# Patient Record
Sex: Female | Born: 1955 | Race: Black or African American | Hispanic: No | Smoking: Never smoker
Health system: Southern US, Community
[De-identification: ages and names within clinical notes are randomized; demographics above are authoritative.]

## PROBLEM LIST (undated history)

## (undated) DIAGNOSIS — I639 Cerebral infarction, unspecified: Secondary | ICD-10-CM

## (undated) HISTORY — PX: REDUCTION MAMMAPLASTY: SUR839

## (undated) HISTORY — DX: Cerebral infarction, unspecified: I63.9

---

## 2011-01-05 DIAGNOSIS — I639 Cerebral infarction, unspecified: Secondary | ICD-10-CM

## 2011-01-05 HISTORY — DX: Cerebral infarction, unspecified: I63.9

## 2015-11-02 ENCOUNTER — Other Ambulatory Visit: Payer: Self-pay | Admitting: Internal Medicine

## 2015-11-02 DIAGNOSIS — Z1231 Encounter for screening mammogram for malignant neoplasm of breast: Secondary | ICD-10-CM

## 2015-11-23 ENCOUNTER — Ambulatory Visit: Payer: Self-pay

## 2015-11-23 ENCOUNTER — Ambulatory Visit
Admission: RE | Admit: 2015-11-23 | Discharge: 2015-11-23 | Disposition: A | Discharge status: Home / Self Care | Payer: Managed Care, Other (non HMO) | Source: Ambulatory Visit | Attending: Internal Medicine | Admitting: Internal Medicine

## 2015-11-23 DIAGNOSIS — Z1231 Encounter for screening mammogram for malignant neoplasm of breast: Secondary | ICD-10-CM

## 2016-01-27 ENCOUNTER — Ambulatory Visit (INDEPENDENT_AMBULATORY_CARE_PROVIDER_SITE_OTHER): Payer: Managed Care, Other (non HMO) | Admitting: Podiatry

## 2016-01-27 ENCOUNTER — Encounter: Payer: Self-pay | Admitting: Podiatry

## 2016-01-27 VITALS — BP 133/86 | HR 94 | Resp 16 | Ht 60.0 in | Wt 209.0 lb

## 2016-01-27 DIAGNOSIS — L600 Ingrowing nail: Secondary | ICD-10-CM

## 2016-01-27 NOTE — Progress Notes (Signed)
   Subjective:    Patient ID: Chloe Collins, female    DOB: 11/02/1956, 60 y.o.   MRN: [id number]  HPI Patient presents with a nail problem on their Right foot; great toe-medial. Pt stated, "wants nail checked to see if have an ingrown toenail".  Review of Systems  Respiratory: Positive for cough.   All other systems reviewed and are negative.      Objective:   Physical Exam        Assessment & Plan:

## 2016-01-27 NOTE — Patient Instructions (Signed)

## 2016-01-30 NOTE — Progress Notes (Signed)
Subjective:     Patient ID: Chloe Collins, female   DOB: 1956-06-21, 60 y.o.   MRN: [id number]  HPI patient presents with painful ingrown toenail the right hallux medial border that she cannot cut and has tried to soak without relief. Has been present for several months and does take Coumadin but her condition is stable   Review of Systems  All other systems reviewed and are negative.      Objective:   Physical Exam  Constitutional: She is oriented to person, place, and time.  Cardiovascular: Intact distal pulses.   Musculoskeletal: Normal range of motion.  Neurological: She is oriented to person, place, and time.  Skin: Skin is warm.  Nursing note and vitals reviewed.  neurovascular status found to be intact muscle strength adequate range of motion within normal limits with no obvious bruising occurring on her extremities. Patient's noted to have an ingrown incurvated right hallux medial border that's painful when pressed and is found to have good digital perfusion and is well oriented 3     Assessment:     Localized ingrown toenail deformity right hallux medial border    Plan:     H&P condition reviewed. I've recommended removal of the corner and explained procedure and risk and she wants this done. At this time I went ahead I infiltrated 60 mg like Marcaine mixture remove the medial border exposed matrix and applied phenol 3 applications 30 seconds followed by alcohol lavage and sterile dressing. I applied compression gave instructions on elevation and reappoint to recheck

## 2016-02-04 ENCOUNTER — Encounter (HOSPITAL_COMMUNITY): Payer: Self-pay | Admitting: Emergency Medicine

## 2016-02-04 ENCOUNTER — Emergency Department (HOSPITAL_COMMUNITY)
Admission: EM | Admit: 2016-02-04 | Discharge: 2016-02-04 | Disposition: A | Discharge status: Home / Self Care | Payer: Managed Care, Other (non HMO) | Attending: Emergency Medicine | Admitting: Emergency Medicine

## 2016-02-04 DIAGNOSIS — J069 Acute upper respiratory infection, unspecified: Secondary | ICD-10-CM

## 2016-02-04 DIAGNOSIS — Z79899 Other long term (current) drug therapy: Secondary | ICD-10-CM | POA: Insufficient documentation

## 2016-02-04 DIAGNOSIS — Z7901 Long term (current) use of anticoagulants: Secondary | ICD-10-CM | POA: Diagnosis not present

## 2016-02-04 DIAGNOSIS — Z8673 Personal history of transient ischemic attack (TIA), and cerebral infarction without residual deficits: Secondary | ICD-10-CM | POA: Insufficient documentation

## 2016-02-04 DIAGNOSIS — Z880 Allergy status to penicillin: Secondary | ICD-10-CM | POA: Diagnosis not present

## 2016-02-04 DIAGNOSIS — Z7984 Long term (current) use of oral hypoglycemic drugs: Secondary | ICD-10-CM | POA: Diagnosis not present

## 2016-02-04 DIAGNOSIS — E1165 Type 2 diabetes mellitus with hyperglycemia: Secondary | ICD-10-CM | POA: Diagnosis present

## 2016-02-04 DIAGNOSIS — R739 Hyperglycemia, unspecified: Secondary | ICD-10-CM

## 2016-02-04 LAB — CBC WITH DIFFERENTIAL/PLATELET
BASOS ABS: 0 10*3/uL (ref 0.0–0.1)
Basophils Relative: 0 %
Eosinophils Absolute: 0 10*3/uL (ref 0.0–0.7)
Eosinophils Relative: 1 %
HEMATOCRIT: 45.6 % (ref 36.0–46.0)
HEMOGLOBIN: 14.6 g/dL (ref 12.0–15.0)
LYMPHS PCT: 18 %
Lymphs Abs: 1.2 10*3/uL (ref 0.7–4.0)
MCH: 28.3 pg (ref 26.0–34.0)
MCHC: 32 g/dL (ref 30.0–36.0)
MCV: 88.5 fL (ref 78.0–100.0)
Monocytes Absolute: 0.2 10*3/uL (ref 0.1–1.0)
Monocytes Relative: 3 %
NEUTROS ABS: 5.4 10*3/uL (ref 1.7–7.7)
NEUTROS PCT: 78 %
Platelets: 263 10*3/uL (ref 150–400)
RBC: 5.15 MIL/uL — AB (ref 3.87–5.11)
RDW: 13.8 % (ref 11.5–15.5)
WBC: 6.9 10*3/uL (ref 4.0–10.5)

## 2016-02-04 LAB — BASIC METABOLIC PANEL
ANION GAP: 11 (ref 5–15)
BUN: 15 mg/dL (ref 6–20)
CO2: 25 mmol/L (ref 22–32)
Calcium: 9 mg/dL (ref 8.9–10.3)
Chloride: 102 mmol/L (ref 101–111)
Creatinine, Ser: 0.87 mg/dL (ref 0.44–1.00)
GFR calc Af Amer: >60 mL/min (ref >60)
GFR calc non Af Amer: >60 mL/min (ref >60)
GLUCOSE: 275 mg/dL — AB (ref 65–99)
POTASSIUM: 5.1 mmol/L (ref 3.5–5.1)
Sodium: 138 mmol/L (ref 135–145)

## 2016-02-04 LAB — CBG MONITORING, ED: Glucose-Capillary: 264 mg/dL — ABNORMAL HIGH (ref 65–99)

## 2016-02-04 NOTE — ED Provider Notes (Signed)
CSN: [id number]     Arrival date & time 02/04/16  1736 History  By signing my name below, I, Emmanuella Mensah, attest that this documentation has been prepared under the direction and in the presence of Anissia Wessells, PA-C . Electronically Signed: Angelene Giovanni, ED Scribe. 02/04/2016. 6:32 PM.    Chief Complaint  Patient presents with  . Hyperglycemia   The history is provided by the patient. No language interpreter was used.   HPI Comments: Chloe Collins is a 60 y.o. female with a hx of DM and stroke who presents to the Emergency Department complaining of hyperglycemia with her highest blood sugar being approx. 404 earlier today PTA. She states that she went to urgent care today for respiratory symptoms and CP where she received Solumedrol and Rocephin after a normal chest x-ray. She adds that her respiratory issues have been ongoing since 01/20/16 where she received a Z-pack and Prednisone from her PCP but needed to go to urgent care today because those medications did not provide relief. She reports that she has been compliant with her Metformin but has not taken her medication today. She denies that she is current smoker. Pt is currently on Albuterol. No fever, SOB, wheezing, or n/v.    Past Medical History  Diagnosis Date  . Stroke [facility]) 01/05/2011    left hemiplegia   History reviewed. No pertinent past surgical history. No family history on file. Social History  Substance Use Topics  . Smoking status: Never Smoker   . Smokeless tobacco: None  . Alcohol Use: None   OB History    No data available     Review of Systems  Constitutional: Positive for chills. Negative for fever.  HENT: Positive for congestion.   Respiratory: Positive for cough. Negative for shortness of breath and wheezing.   Gastrointestinal: Negative for nausea and vomiting.      Allergies  Amoxicillin and Sulfa antibiotics  Home Medications   Prior to Admission medications   Medication Sig  Start Date End Date Taking? Authorizing Provider  albuterol (PROVENTIL) (2.5 MG/3ML) 0.083% nebulizer solution  10/30/15   Historical Provider, MD  atorvastatin (LIPITOR) 40 MG tablet  01/12/16   Historical Provider, MD  buPROPion (WELLBUTRIN XL) 150 MG 24 hr tablet  01/25/16   Historical Provider, MD  CHERATUSSIN AC 100-10 MG/5ML syrup  01/20/16   Historical Provider, MD  escitalopram (LEXAPRO) 20 MG tablet  01/25/16   Historical Provider, MD  FREESTYLE LITE test strip  01/26/16   Historical Provider, MD  lisinopril (PRINIVIL,ZESTRIL) 10 MG tablet  01/26/16   Historical Provider, MD  metFORMIN (GLUCOPHAGE) 1000 MG tablet  01/12/16   Historical Provider, MD  PROAIR HFA 108 [phone] Base) MCG/ACT inhaler  10/27/15   Historical Provider, MD  warfarin (COUMADIN) 5 MG tablet Take 5 mg by mouth daily. Pt takes every day, except on Tuesday and Thursday - takes 2.5 mg on those days. Dosage subject to INR 01/25/16   Historical Provider, MD   BP 145/78 mmHg  Pulse 98  Temp(Src) 98 F (36.7 C) (Oral)  Resp 18  SpO2 96% Physical Exam  Constitutional: She is oriented to person, place, and time. She appears well-developed and well-nourished. No distress.  HENT:  Head: Normocephalic and atraumatic.  Right Ear: External ear normal.  Left Ear: External ear normal.  Nose: Nose normal.  Mouth/Throat: Oropharynx is clear and moist.  Eyes: Conjunctivae and EOM are normal.  Neck: Neck supple. No tracheal deviation present.  Cardiovascular: Normal rate,  regular rhythm and normal heart sounds.   Pulmonary/Chest: Effort normal and breath sounds normal. No respiratory distress. She has no wheezes. She has no rales.  Musculoskeletal: Normal range of motion.  Neurological: She is alert and oriented to person, place, and time.  Skin: Skin is warm and dry.  Psychiatric: She has a normal mood and affect. Her behavior is normal.  Nursing note and vitals reviewed.   ED Course  Procedures (including critical care  time) DIAGNOSTIC STUDIES: Oxygen Saturation is 96% on RA, normal by my interpretation.    COORDINATION OF CARE: 6:29 PM- Pt advised of plan for treatment and pt agrees. Pt will receive blood work for further evaluation.    Labs Review Labs Reviewed  CBG MONITORING, ED - Abnormal; Notable for the following:    Glucose-Capillary 264 (*)    All other components within normal limits  CBC WITH DIFFERENTIAL/PLATELET  BASIC METABOLIC PANEL    Jaynie Crumble, PA-C has personally reviewed and evaluated these images and lab results as part of her medical decision-making.   MDM   Final diagnoses:  Hyperglycemia  URI (upper respiratory infection)   Patient with upper respiratory symptoms, has already taken a Z-Pak, prednisone, today she received Solu-Medrol, Rocephin IM at doctor's office. She is also using albuterol inhaler. She states she feels better but since receiving Solu-Medrol her blood sugar went up to 400. She was sent here for further evaluation. Today her blood sugar is 264 here. I will check metabolic panel. She has no other complaints. Vital signs are normal.  Patient is metabolic panel is normal other than hyperglycemia. Blood sugar is 275. This could be elevated from Solu-Medrol which patient received recently. She also has not taken her metformin today. I do not think she needs any further emergent workup. She'll be discharged home, instructed to take her metformin and have her follow up with primary care doctor. Also advised to make sure and watch her diet extra carefully since she received steroid injection today  Filed Vitals:   02/04/16 1754  BP: 145/78  Pulse: 98  Temp: 98 F (36.7 C)  TempSrc: Oral  Resp: 18  SpO2: 96%     Jaynie Crumble, PA-C 02/04/16 1930  Raeford Razor, MD 02/09/16 [phone]

## 2016-02-04 NOTE — ED Notes (Signed)
Pt reports been seen at urgent care for URI, received solumedrol. CBG at urgent care was high per pt and came to ed for further evaluation.  pt has type 2 diabetes,ans sts has not take her metformin today. CBG tested in triage 265. Pt denies any symptoms . Alert and oriented x 4.

## 2016-02-04 NOTE — Discharge Instructions (Signed)
Make sure to take your metformin when you get home. Watch your diet carefully. Follow up with your doctor for recheck.   Hyperglycemia Hyperglycemia occurs when the glucose (sugar) in your blood is too high. Hyperglycemia can happen for many reasons, but it most often happens to people who do not know they have diabetes or are not managing their diabetes properly.  CAUSES  Whether you have diabetes or not, there are other causes of hyperglycemia. Hyperglycemia can occur when you have diabetes, but it can also occur in other situations that you might not be as aware of, such as: Diabetes  If you have diabetes and are having problems controlling your blood glucose, hyperglycemia could occur because of some of the following reasons:  Not following your meal plan.  Not taking your diabetes medications or not taking it properly.  Exercising less or doing less activity than you normally do.  Being sick. Pre-diabetes  This cannot be ignored. Before people develop Type 2 diabetes, they almost always have "pre-diabetes." This is when your blood glucose levels are higher than normal, but not yet high enough to be diagnosed as diabetes. Research has shown that some long-term damage to the body, especially the heart and circulatory system, may already be occurring during pre-diabetes. If you take action to manage your blood glucose when you have pre-diabetes, you may delay or prevent Type 2 diabetes from developing. Stress  If you have diabetes, you may be "diet" controlled or on oral medications or insulin to control your diabetes. However, you may find that your blood glucose is higher than usual in the hospital whether you have diabetes or not. This is often referred to as "stress hyperglycemia." Stress can elevate your blood glucose. This happens because of hormones put out by the body during times of stress. If stress has been the cause of your high blood glucose, it can be followed regularly by your  caregiver. That way he/she can make sure your hyperglycemia does not continue to get worse or progress to diabetes. Steroids  Steroids are medications that act on the infection fighting system (immune system) to block inflammation or infection. One side effect can be a rise in blood glucose. Most people can produce enough extra insulin to allow for this rise, but for those who cannot, steroids make blood glucose levels go even higher. It is not unusual for steroid treatments to "uncover" diabetes that is developing. It is not always possible to determine if the hyperglycemia will go away after the steroids are stopped. A special blood test called an A1c is sometimes done to determine if your blood glucose was elevated before the steroids were started. SYMPTOMS  Thirsty.  Frequent urination.  Dry mouth.  Blurred vision.  Tired or fatigue.  Weakness.  Sleepy.  Tingling in feet or leg. DIAGNOSIS  Diagnosis is made by monitoring blood glucose in one or all of the following ways:  A1c test. This is a chemical found in your blood.  Fingerstick blood glucose monitoring.  Laboratory results. TREATMENT  First, knowing the cause of the hyperglycemia is important before the hyperglycemia can be treated. Treatment may include, but is not be limited to:  Education.  Change or adjustment in medications.  Change or adjustment in meal plan.  Treatment for an illness, infection, etc.  More frequent blood glucose monitoring.  Change in exercise plan.  Decreasing or stopping steroids.  Lifestyle changes. HOME CARE INSTRUCTIONS   Test your blood glucose as directed.  Exercise regularly.  Your caregiver will give you instructions about exercise. Pre-diabetes or diabetes which comes on with stress is helped by exercising.  Eat wholesome, balanced meals. Eat often and at regular, fixed times. Your caregiver or nutritionist will give you a meal plan to guide your sugar intake.  Being at  an ideal weight is important. If needed, losing as little as 10 to 15 pounds may help improve blood glucose levels. SEEK MEDICAL CARE IF:   You have questions about medicine, activity, or diet.  You continue to have symptoms (problems such as increased thirst, urination, or weight gain). SEEK IMMEDIATE MEDICAL CARE IF:   You are vomiting or have diarrhea.  Your breath smells fruity.  You are breathing faster or slower.  You are very sleepy or incoherent.  You have numbness, tingling, or pain in your feet or hands.  You have chest pain.  Your symptoms get worse even though you have been following your caregiver's orders.  If you have any other questions or concerns.   This information is not intended to replace advice given to you by your health care provider. Make sure you discuss any questions you have with your health care provider.   Document Released: 05/29/2001 Document Revised: 02/25/2012 Document Reviewed: 08/09/2015 Elsevier Interactive Patient Education [employer].

## 2016-11-16 ENCOUNTER — Other Ambulatory Visit: Payer: Self-pay | Admitting: Family Medicine

## 2016-11-16 DIAGNOSIS — Z1231 Encounter for screening mammogram for malignant neoplasm of breast: Secondary | ICD-10-CM

## 2017-02-05 ENCOUNTER — Ambulatory Visit
Admission: RE | Admit: 2017-02-05 | Discharge: 2017-02-05 | Disposition: A | Discharge status: Home / Self Care | Payer: BLUE CROSS/BLUE SHIELD | Source: Ambulatory Visit | Attending: Family Medicine | Admitting: Family Medicine

## 2017-02-05 DIAGNOSIS — Z1231 Encounter for screening mammogram for malignant neoplasm of breast: Secondary | ICD-10-CM

## 2017-05-15 ENCOUNTER — Other Ambulatory Visit: Payer: Self-pay | Admitting: Family Medicine

## 2017-05-15 DIAGNOSIS — R918 Other nonspecific abnormal finding of lung field: Secondary | ICD-10-CM

## 2017-05-16 ENCOUNTER — Ambulatory Visit
Admission: RE | Admit: 2017-05-16 | Discharge: 2017-05-16 | Disposition: A | Discharge status: Home / Self Care | Payer: BLUE CROSS/BLUE SHIELD | Source: Ambulatory Visit | Attending: Family Medicine | Admitting: Family Medicine

## 2017-05-16 DIAGNOSIS — R918 Other nonspecific abnormal finding of lung field: Secondary | ICD-10-CM

## 2017-05-16 MED ORDER — IOPAMIDOL (ISOVUE-300) INJECTION 61%
75.0000 mL | Freq: Once | INTRAVENOUS | Status: AC | PRN
  Administered 2017-05-16: 75 mL via INTRAVENOUS

## 2018-03-27 ENCOUNTER — Other Ambulatory Visit: Payer: Self-pay | Admitting: Family Medicine

## 2018-03-27 DIAGNOSIS — Z1231 Encounter for screening mammogram for malignant neoplasm of breast: Secondary | ICD-10-CM

## 2018-04-24 ENCOUNTER — Ambulatory Visit
Admission: RE | Admit: 2018-04-24 | Discharge: 2018-04-24 | Disposition: A | Discharge status: Home / Self Care | Payer: BLUE CROSS/BLUE SHIELD | Source: Ambulatory Visit | Attending: Family Medicine | Admitting: Family Medicine

## 2018-04-24 DIAGNOSIS — Z1231 Encounter for screening mammogram for malignant neoplasm of breast: Secondary | ICD-10-CM

## 2018-11-19 DIAGNOSIS — Z86711 Personal history of pulmonary embolism: Secondary | ICD-10-CM | POA: Diagnosis not present

## 2018-11-19 DIAGNOSIS — Z7901 Long term (current) use of anticoagulants: Secondary | ICD-10-CM | POA: Diagnosis not present

## 2018-11-24 DIAGNOSIS — Z0100 Encounter for examination of eyes and vision without abnormal findings: Secondary | ICD-10-CM | POA: Diagnosis not present

## 2018-12-18 DIAGNOSIS — D6859 Other primary thrombophilia: Secondary | ICD-10-CM | POA: Diagnosis not present

## 2018-12-18 DIAGNOSIS — Z7901 Long term (current) use of anticoagulants: Secondary | ICD-10-CM | POA: Diagnosis not present

## 2018-12-26 DIAGNOSIS — Z86711 Personal history of pulmonary embolism: Secondary | ICD-10-CM | POA: Diagnosis not present

## 2018-12-26 DIAGNOSIS — Z7901 Long term (current) use of anticoagulants: Secondary | ICD-10-CM | POA: Diagnosis not present

## 2019-01-05 DIAGNOSIS — Z7901 Long term (current) use of anticoagulants: Secondary | ICD-10-CM | POA: Diagnosis not present

## 2019-02-12 DIAGNOSIS — Z7901 Long term (current) use of anticoagulants: Secondary | ICD-10-CM | POA: Diagnosis not present

## 2019-02-20 DIAGNOSIS — Z7901 Long term (current) use of anticoagulants: Secondary | ICD-10-CM | POA: Diagnosis not present

## 2019-02-20 DIAGNOSIS — Z86711 Personal history of pulmonary embolism: Secondary | ICD-10-CM | POA: Diagnosis not present

## 2019-02-27 DIAGNOSIS — Z7901 Long term (current) use of anticoagulants: Secondary | ICD-10-CM | POA: Diagnosis not present

## 2019-02-27 DIAGNOSIS — Z86711 Personal history of pulmonary embolism: Secondary | ICD-10-CM | POA: Diagnosis not present

## 2019-02-28 DIAGNOSIS — J209 Acute bronchitis, unspecified: Secondary | ICD-10-CM | POA: Diagnosis not present

## 2019-03-06 DIAGNOSIS — Z7901 Long term (current) use of anticoagulants: Secondary | ICD-10-CM | POA: Diagnosis not present

## 2019-03-06 DIAGNOSIS — I6930 Unspecified sequelae of cerebral infarction: Secondary | ICD-10-CM | POA: Diagnosis not present

## 2019-03-20 DIAGNOSIS — Z86711 Personal history of pulmonary embolism: Secondary | ICD-10-CM | POA: Diagnosis not present

## 2019-03-20 DIAGNOSIS — Z7901 Long term (current) use of anticoagulants: Secondary | ICD-10-CM | POA: Diagnosis not present

## 2019-04-03 ENCOUNTER — Other Ambulatory Visit: Payer: Self-pay | Admitting: Family Medicine

## 2019-04-03 DIAGNOSIS — Z1231 Encounter for screening mammogram for malignant neoplasm of breast: Secondary | ICD-10-CM

## 2019-04-15 DIAGNOSIS — E1159 Type 2 diabetes mellitus with other circulatory complications: Secondary | ICD-10-CM | POA: Diagnosis not present

## 2019-04-15 DIAGNOSIS — J45909 Unspecified asthma, uncomplicated: Secondary | ICD-10-CM | POA: Diagnosis not present

## 2019-04-15 DIAGNOSIS — I6930 Unspecified sequelae of cerebral infarction: Secondary | ICD-10-CM | POA: Diagnosis not present

## 2019-04-15 DIAGNOSIS — Z0000 Encounter for general adult medical examination without abnormal findings: Secondary | ICD-10-CM | POA: Diagnosis not present

## 2019-04-15 DIAGNOSIS — E7800 Pure hypercholesterolemia, unspecified: Secondary | ICD-10-CM | POA: Diagnosis not present

## 2019-04-15 DIAGNOSIS — I10 Essential (primary) hypertension: Secondary | ICD-10-CM | POA: Diagnosis not present

## 2019-04-17 DIAGNOSIS — I10 Essential (primary) hypertension: Secondary | ICD-10-CM | POA: Diagnosis not present

## 2019-04-17 DIAGNOSIS — E7800 Pure hypercholesterolemia, unspecified: Secondary | ICD-10-CM | POA: Diagnosis not present

## 2019-04-17 DIAGNOSIS — E1159 Type 2 diabetes mellitus with other circulatory complications: Secondary | ICD-10-CM | POA: Diagnosis not present

## 2019-04-17 DIAGNOSIS — Z7901 Long term (current) use of anticoagulants: Secondary | ICD-10-CM | POA: Diagnosis not present

## 2019-04-24 DIAGNOSIS — Z7901 Long term (current) use of anticoagulants: Secondary | ICD-10-CM | POA: Diagnosis not present

## 2019-04-24 DIAGNOSIS — Z86711 Personal history of pulmonary embolism: Secondary | ICD-10-CM | POA: Diagnosis not present

## 2019-05-01 DIAGNOSIS — Z7901 Long term (current) use of anticoagulants: Secondary | ICD-10-CM | POA: Diagnosis not present

## 2019-05-01 DIAGNOSIS — D6859 Other primary thrombophilia: Secondary | ICD-10-CM | POA: Diagnosis not present

## 2019-05-15 DIAGNOSIS — D6859 Other primary thrombophilia: Secondary | ICD-10-CM | POA: Diagnosis not present

## 2019-05-15 DIAGNOSIS — Z7901 Long term (current) use of anticoagulants: Secondary | ICD-10-CM | POA: Diagnosis not present

## 2019-05-28 ENCOUNTER — Ambulatory Visit: Payer: BLUE CROSS/BLUE SHIELD

## 2019-06-23 DIAGNOSIS — Z7901 Long term (current) use of anticoagulants: Secondary | ICD-10-CM | POA: Diagnosis not present

## 2019-07-21 ENCOUNTER — Other Ambulatory Visit: Payer: Self-pay

## 2019-07-21 ENCOUNTER — Ambulatory Visit
Admission: RE | Admit: 2019-07-21 | Discharge: 2019-07-21 | Disposition: A | Discharge status: Home / Self Care | Payer: Medicare HMO | Source: Ambulatory Visit | Attending: Family Medicine | Admitting: Family Medicine

## 2019-07-21 DIAGNOSIS — Z1231 Encounter for screening mammogram for malignant neoplasm of breast: Secondary | ICD-10-CM | POA: Diagnosis not present

## 2019-07-24 DIAGNOSIS — Z7901 Long term (current) use of anticoagulants: Secondary | ICD-10-CM | POA: Diagnosis not present

## 2019-07-24 DIAGNOSIS — D6859 Other primary thrombophilia: Secondary | ICD-10-CM | POA: Diagnosis not present

## 2019-08-11 DIAGNOSIS — E113293 Type 2 diabetes mellitus with mild nonproliferative diabetic retinopathy without macular edema, bilateral: Secondary | ICD-10-CM | POA: Diagnosis not present

## 2019-08-21 DIAGNOSIS — Z7901 Long term (current) use of anticoagulants: Secondary | ICD-10-CM | POA: Diagnosis not present

## 2019-08-28 DIAGNOSIS — Z7901 Long term (current) use of anticoagulants: Secondary | ICD-10-CM | POA: Diagnosis not present

## 2019-09-25 DIAGNOSIS — Z7901 Long term (current) use of anticoagulants: Secondary | ICD-10-CM | POA: Diagnosis not present

## 2019-09-25 DIAGNOSIS — D6859 Other primary thrombophilia: Secondary | ICD-10-CM | POA: Diagnosis not present

## 2019-09-29 DIAGNOSIS — Z86711 Personal history of pulmonary embolism: Secondary | ICD-10-CM | POA: Diagnosis not present

## 2019-09-29 DIAGNOSIS — Z23 Encounter for immunization: Secondary | ICD-10-CM | POA: Diagnosis not present

## 2019-09-29 DIAGNOSIS — Z1211 Encounter for screening for malignant neoplasm of colon: Secondary | ICD-10-CM | POA: Diagnosis not present

## 2019-09-29 DIAGNOSIS — J45909 Unspecified asthma, uncomplicated: Secondary | ICD-10-CM | POA: Diagnosis not present

## 2019-09-29 DIAGNOSIS — I6930 Unspecified sequelae of cerebral infarction: Secondary | ICD-10-CM | POA: Diagnosis not present

## 2019-09-29 DIAGNOSIS — I10 Essential (primary) hypertension: Secondary | ICD-10-CM | POA: Diagnosis not present

## 2019-09-29 DIAGNOSIS — Z7901 Long term (current) use of anticoagulants: Secondary | ICD-10-CM | POA: Diagnosis not present

## 2019-09-29 DIAGNOSIS — E1159 Type 2 diabetes mellitus with other circulatory complications: Secondary | ICD-10-CM | POA: Diagnosis not present

## 2019-09-29 DIAGNOSIS — E7800 Pure hypercholesterolemia, unspecified: Secondary | ICD-10-CM | POA: Diagnosis not present

## 2019-10-01 DIAGNOSIS — Z1211 Encounter for screening for malignant neoplasm of colon: Secondary | ICD-10-CM | POA: Diagnosis not present

## 2019-10-06 DIAGNOSIS — Z7901 Long term (current) use of anticoagulants: Secondary | ICD-10-CM | POA: Diagnosis not present

## 2019-10-09 DIAGNOSIS — Z7901 Long term (current) use of anticoagulants: Secondary | ICD-10-CM | POA: Diagnosis not present

## 2019-10-09 DIAGNOSIS — Z86711 Personal history of pulmonary embolism: Secondary | ICD-10-CM | POA: Diagnosis not present

## 2019-10-14 DIAGNOSIS — D6859 Other primary thrombophilia: Secondary | ICD-10-CM | POA: Diagnosis not present

## 2019-10-14 DIAGNOSIS — Z7901 Long term (current) use of anticoagulants: Secondary | ICD-10-CM | POA: Diagnosis not present

## 2019-10-20 DIAGNOSIS — Z7901 Long term (current) use of anticoagulants: Secondary | ICD-10-CM | POA: Diagnosis not present

## 2019-10-27 DIAGNOSIS — Z7901 Long term (current) use of anticoagulants: Secondary | ICD-10-CM | POA: Diagnosis not present

## 2019-11-10 DIAGNOSIS — Z7901 Long term (current) use of anticoagulants: Secondary | ICD-10-CM | POA: Diagnosis not present

## 2019-12-08 DIAGNOSIS — Z7901 Long term (current) use of anticoagulants: Secondary | ICD-10-CM | POA: Diagnosis not present

## 2019-12-18 IMAGING — MG DIGITAL SCREENING BILATERAL MAMMOGRAM WITH TOMO AND CAD
8 series · 8 of 24 positions shown · non-contrast
Comparison: Previous exam(s).

CLINICAL DATA: Screening.

EXAM:
DIGITAL SCREENING BILATERAL MAMMOGRAM WITH TOMO AND CAD

[R CC synth-2D]
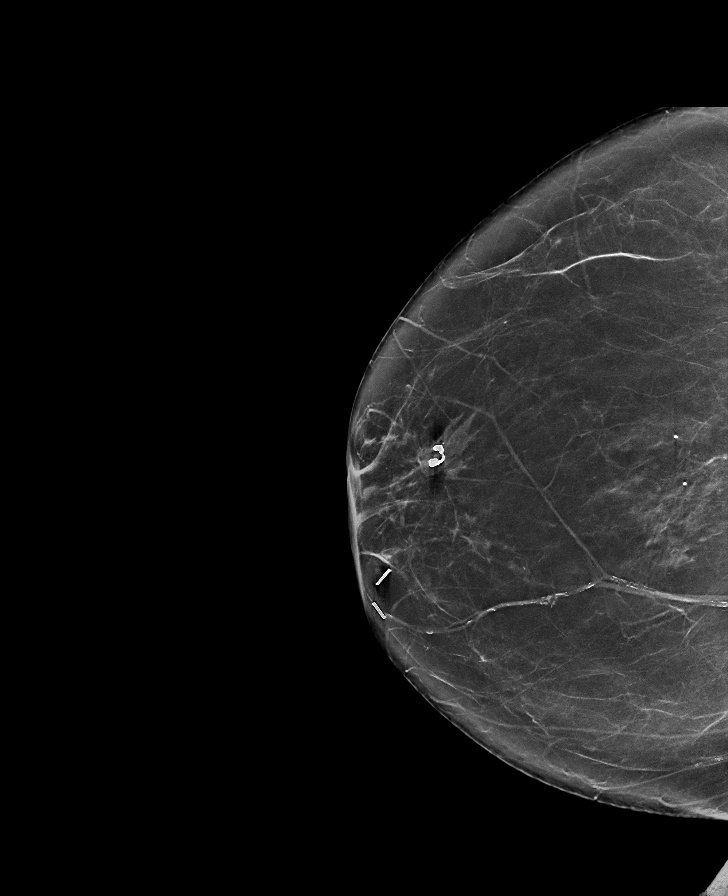

[L MLO synth-2D]
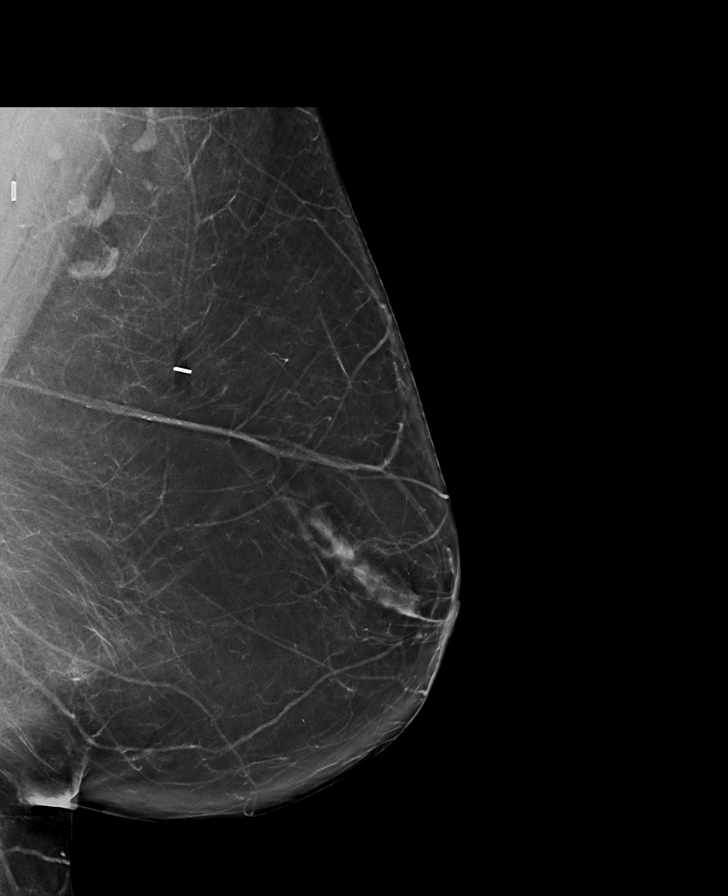

[R MLO synth-2D]
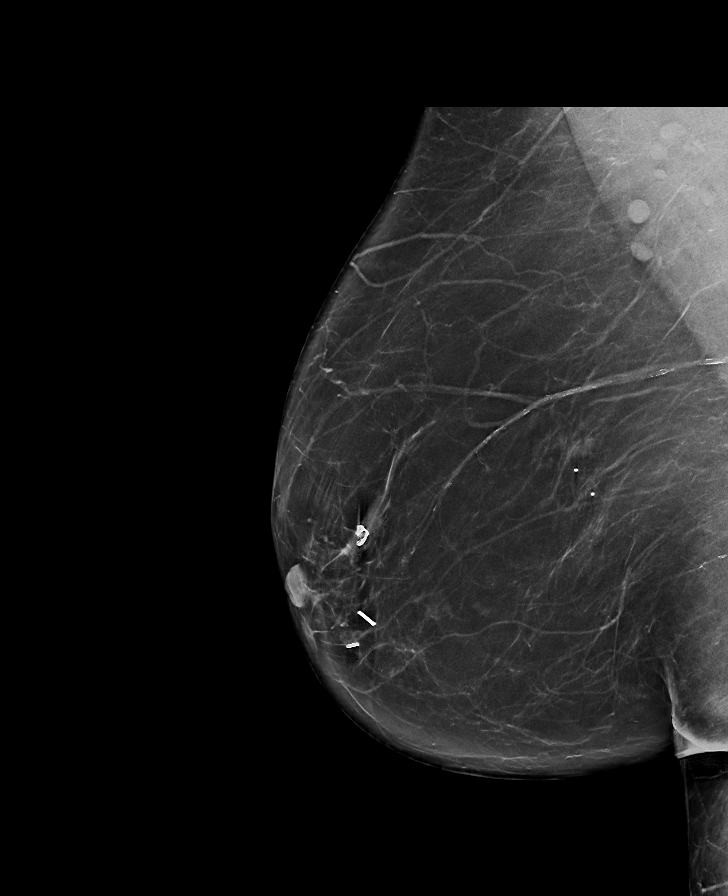

[L CC synth-2D]
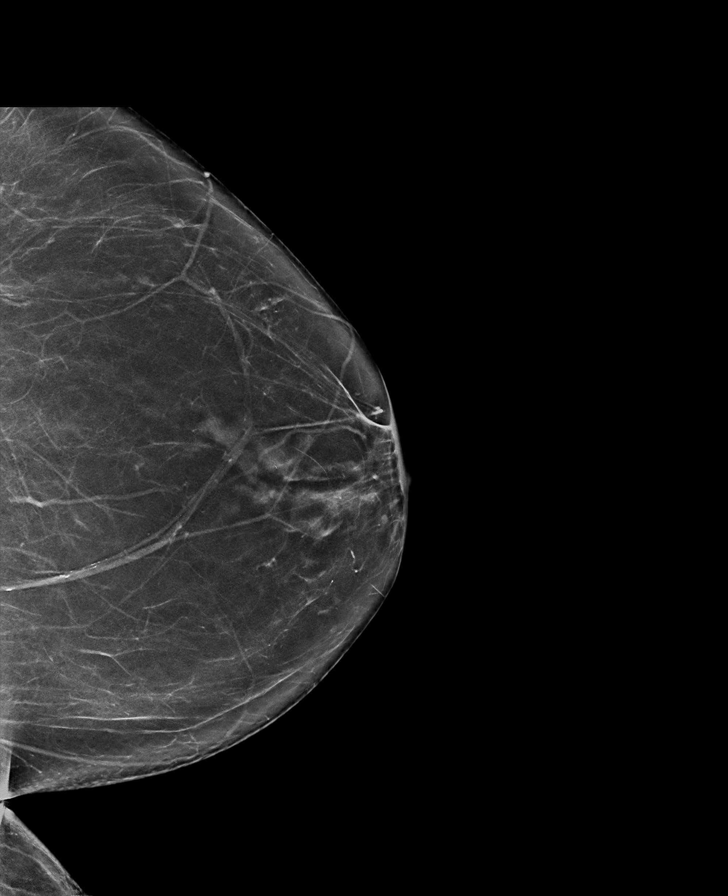

[R CC tomo · tomo slice 36/71.0]
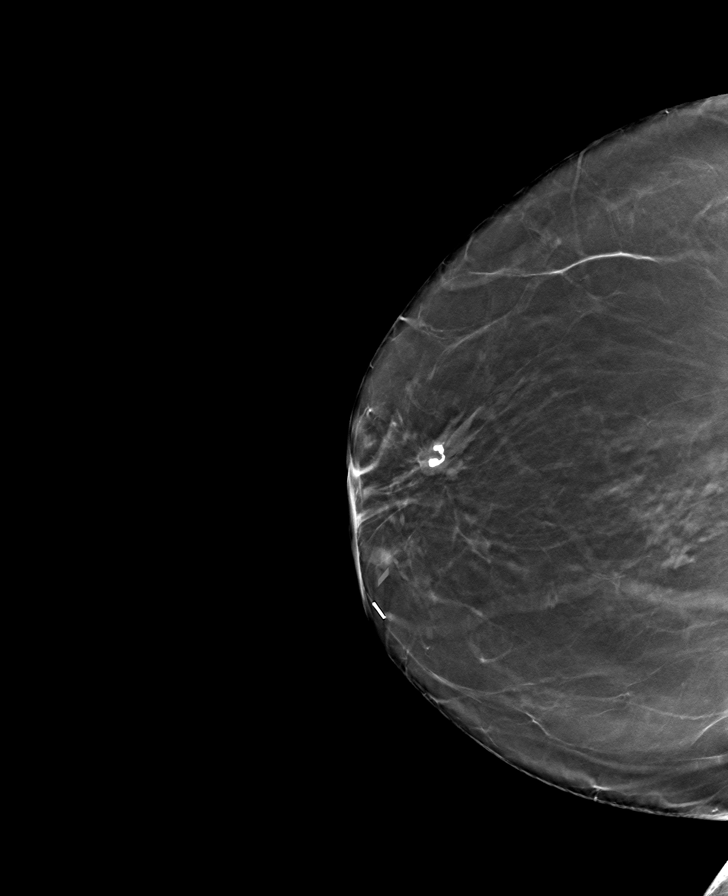

[L CC tomo · tomo slice 35/69.0]
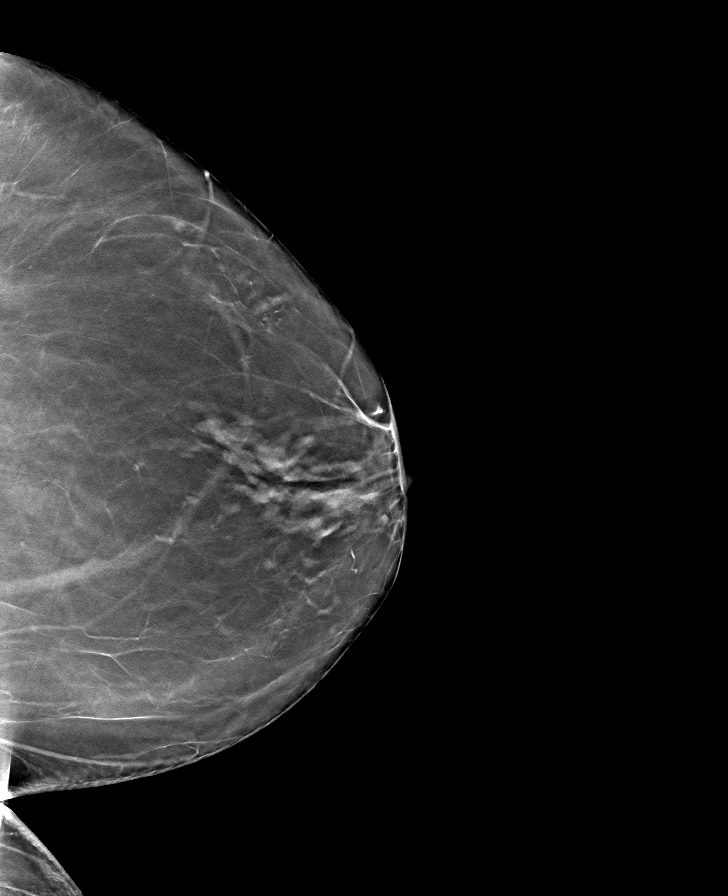

[L MLO tomo · tomo slice 45/88.0]
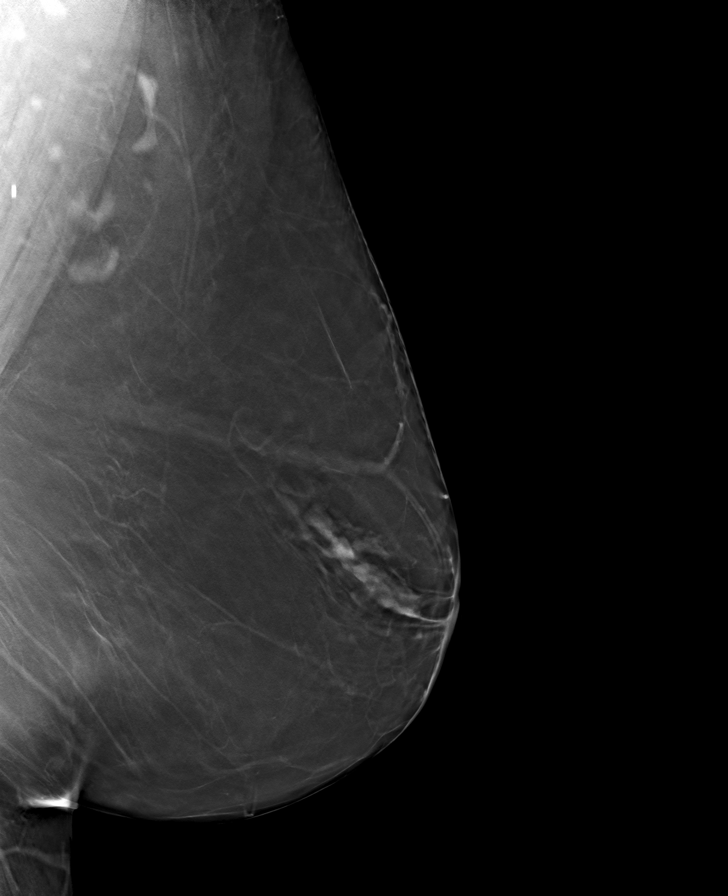

[R MLO tomo · tomo slice 43/86.0]
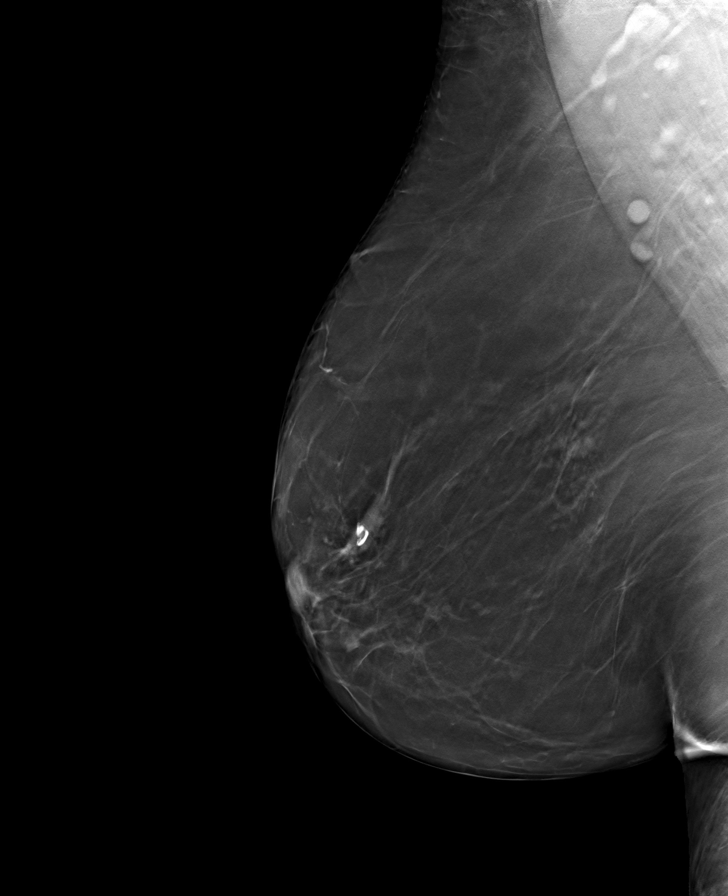

[8 of 24 positions shown; findings below may reference images not displayed]

ACR Breast Density Category b: There are scattered areas of
fibroglandular density.
FINDINGS: There are no findings suspicious for malignancy. Images were
processed with CAD.
IMPRESSION: No mammographic evidence of malignancy. A result letter of this
screening mammogram will be mailed directly to the patient.

RECOMMENDATION:
Screening mammogram in one year. (Code:[id number])

BI-RADS CATEGORY  1: Negative.

## 2020-01-08 DIAGNOSIS — D6859 Other primary thrombophilia: Secondary | ICD-10-CM | POA: Diagnosis not present

## 2020-01-08 DIAGNOSIS — Z7901 Long term (current) use of anticoagulants: Secondary | ICD-10-CM | POA: Diagnosis not present

## 2020-02-05 DIAGNOSIS — Z7901 Long term (current) use of anticoagulants: Secondary | ICD-10-CM | POA: Diagnosis not present

## 2020-02-05 DIAGNOSIS — I6930 Unspecified sequelae of cerebral infarction: Secondary | ICD-10-CM | POA: Diagnosis not present

## 2020-02-12 DIAGNOSIS — Z7901 Long term (current) use of anticoagulants: Secondary | ICD-10-CM | POA: Diagnosis not present

## 2020-02-12 DIAGNOSIS — I6930 Unspecified sequelae of cerebral infarction: Secondary | ICD-10-CM | POA: Diagnosis not present

## 2020-02-26 DIAGNOSIS — D6859 Other primary thrombophilia: Secondary | ICD-10-CM | POA: Diagnosis not present

## 2020-02-26 DIAGNOSIS — Z7901 Long term (current) use of anticoagulants: Secondary | ICD-10-CM | POA: Diagnosis not present

## 2020-03-03 DIAGNOSIS — Z7901 Long term (current) use of anticoagulants: Secondary | ICD-10-CM | POA: Diagnosis not present

## 2020-03-10 DIAGNOSIS — D6859 Other primary thrombophilia: Secondary | ICD-10-CM | POA: Diagnosis not present

## 2020-03-10 DIAGNOSIS — Z7901 Long term (current) use of anticoagulants: Secondary | ICD-10-CM | POA: Diagnosis not present

## 2020-03-21 DIAGNOSIS — Z7901 Long term (current) use of anticoagulants: Secondary | ICD-10-CM | POA: Diagnosis not present

## 2020-03-30 DIAGNOSIS — F418 Other specified anxiety disorders: Secondary | ICD-10-CM | POA: Diagnosis not present

## 2020-03-30 DIAGNOSIS — E7800 Pure hypercholesterolemia, unspecified: Secondary | ICD-10-CM | POA: Diagnosis not present

## 2020-03-30 DIAGNOSIS — I10 Essential (primary) hypertension: Secondary | ICD-10-CM | POA: Diagnosis not present

## 2020-03-30 DIAGNOSIS — Z7901 Long term (current) use of anticoagulants: Secondary | ICD-10-CM | POA: Diagnosis not present

## 2020-03-30 DIAGNOSIS — J45909 Unspecified asthma, uncomplicated: Secondary | ICD-10-CM | POA: Diagnosis not present

## 2020-03-30 DIAGNOSIS — E1159 Type 2 diabetes mellitus with other circulatory complications: Secondary | ICD-10-CM | POA: Diagnosis not present

## 2020-03-30 DIAGNOSIS — I6930 Unspecified sequelae of cerebral infarction: Secondary | ICD-10-CM | POA: Diagnosis not present

## 2020-03-30 DIAGNOSIS — Z86711 Personal history of pulmonary embolism: Secondary | ICD-10-CM | POA: Diagnosis not present

## 2020-04-04 DIAGNOSIS — Z86711 Personal history of pulmonary embolism: Secondary | ICD-10-CM | POA: Diagnosis not present

## 2020-04-04 DIAGNOSIS — Z7901 Long term (current) use of anticoagulants: Secondary | ICD-10-CM | POA: Diagnosis not present

## 2020-04-11 DIAGNOSIS — Z7901 Long term (current) use of anticoagulants: Secondary | ICD-10-CM | POA: Diagnosis not present

## 2020-04-18 DIAGNOSIS — E559 Vitamin D deficiency, unspecified: Secondary | ICD-10-CM | POA: Diagnosis not present

## 2020-04-18 DIAGNOSIS — K219 Gastro-esophageal reflux disease without esophagitis: Secondary | ICD-10-CM | POA: Diagnosis not present

## 2020-04-18 DIAGNOSIS — D684 Acquired coagulation factor deficiency: Secondary | ICD-10-CM | POA: Diagnosis not present

## 2020-04-18 DIAGNOSIS — J45909 Unspecified asthma, uncomplicated: Secondary | ICD-10-CM | POA: Diagnosis not present

## 2020-04-18 DIAGNOSIS — E785 Hyperlipidemia, unspecified: Secondary | ICD-10-CM | POA: Diagnosis not present

## 2020-04-18 DIAGNOSIS — Z8673 Personal history of transient ischemic attack (TIA), and cerebral infarction without residual deficits: Secondary | ICD-10-CM | POA: Diagnosis not present

## 2020-04-18 DIAGNOSIS — E139 Other specified diabetes mellitus without complications: Secondary | ICD-10-CM | POA: Diagnosis not present

## 2020-05-03 DIAGNOSIS — D684 Acquired coagulation factor deficiency: Secondary | ICD-10-CM | POA: Diagnosis not present

## 2020-05-03 DIAGNOSIS — Z7901 Long term (current) use of anticoagulants: Secondary | ICD-10-CM | POA: Diagnosis not present
